# Patient Record
Sex: Male | Born: 1995 | Race: White | Hispanic: No | Marital: Single | Smoking: Never smoker
Health system: Southern US, Community
[De-identification: ages and names within clinical notes are randomized; demographics above are authoritative.]

---
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
## 2014-01-09 ENCOUNTER — Emergency Department: Payer: Self-pay

## 2016-10-31 ENCOUNTER — Emergency Department: Payer: Self-pay

## 2016-10-31 ENCOUNTER — Encounter: Payer: Self-pay | Admitting: Emergency Medicine

## 2016-10-31 ENCOUNTER — Emergency Department
Admission: EM | Admit: 2016-10-31 | Discharge: 2016-10-31 | Disposition: A | Discharge status: Home / Self Care | Payer: Self-pay | Attending: Student in an Organized Health Care Education/Training Program | Admitting: Student in an Organized Health Care Education/Training Program

## 2016-10-31 DIAGNOSIS — S8002XA Contusion of left knee, initial encounter: Secondary | ICD-10-CM | POA: Insufficient documentation

## 2016-10-31 DIAGNOSIS — Y999 Unspecified external cause status: Secondary | ICD-10-CM | POA: Insufficient documentation

## 2016-10-31 DIAGNOSIS — M25572 Pain in left ankle and joints of left foot: Secondary | ICD-10-CM

## 2016-10-31 DIAGNOSIS — Y929 Unspecified place or not applicable: Secondary | ICD-10-CM | POA: Insufficient documentation

## 2016-10-31 DIAGNOSIS — F17200 Nicotine dependence, unspecified, uncomplicated: Secondary | ICD-10-CM | POA: Insufficient documentation

## 2016-10-31 DIAGNOSIS — Y9389 Activity, other specified: Secondary | ICD-10-CM | POA: Insufficient documentation

## 2016-10-31 MED ORDER — NAPROXEN 500 MG PO TABS: 500.0000 mg | ORAL_TABLET | Freq: Two times a day (BID) | ORAL | 0 refills | Status: AC

## 2016-10-31 MED ORDER — HYDROCODONE-ACETAMINOPHEN 5-325 MG PO TABS
1.0000 | ORAL_TABLET | Freq: Once | ORAL | Status: AC
  Administered 2016-10-31: 1 via ORAL
  Filled 2016-10-31: qty 1
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
## 2016-10-31 NOTE — ED Notes (Signed)
NAD noted at time of D/C. Pt denies questions or concerns. Pt ambulatory to the lobby at this time.  
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
## 2022-02-18 ENCOUNTER — Other Ambulatory Visit: Payer: Self-pay

## 2022-02-18 ENCOUNTER — Emergency Department: Payer: Managed Care, Other (non HMO)

## 2022-02-18 ENCOUNTER — Emergency Department
Admission: EM | Admit: 2022-02-18 | Discharge: 2022-02-18 | Disposition: A | Discharge status: Home / Self Care | Payer: Managed Care, Other (non HMO) | Attending: Emergency Medicine | Admitting: Emergency Medicine

## 2022-02-18 DIAGNOSIS — K2900 Acute gastritis without bleeding: Secondary | ICD-10-CM | POA: Diagnosis not present

## 2022-02-18 DIAGNOSIS — R1010 Upper abdominal pain, unspecified: Secondary | ICD-10-CM | POA: Diagnosis present

## 2022-02-18 LAB — CBC
HCT: 48.8 % (ref 39.0–52.0)
Hemoglobin: 16.5 g/dL (ref 13.0–17.0)
MCH: 31.5 pg (ref 26.0–34.0)
MCHC: 33.8 g/dL (ref 30.0–36.0)
MCV: 93.1 fL (ref 80.0–100.0)
Platelets: 245 10*3/uL (ref 150–400)
RBC: 5.24 MIL/uL (ref 4.22–5.81)
RDW: 12.1 % (ref 11.5–15.5)
WBC: 13.2 10*3/uL — ABNORMAL HIGH (ref 4.0–10.5)
nRBC: 0 % (ref 0.0–0.2)

## 2022-02-18 LAB — COMPREHENSIVE METABOLIC PANEL
ALT: 30 U/L (ref 0–44)
AST: 26 U/L (ref 15–41)
Albumin: 4.6 g/dL (ref 3.5–5.0)
Alkaline Phosphatase: 71 U/L (ref 38–126)
Anion gap: 8 (ref 5–15)
BUN: 8 mg/dL (ref 6–20)
CO2: 24 mmol/L (ref 22–32)
Calcium: 9.7 mg/dL (ref 8.9–10.3)
Chloride: 106 mmol/L (ref 98–111)
Creatinine, Ser: 1.08 mg/dL (ref 0.61–1.24)
GFR, Estimated: >60 mL/min (ref >60)
Glucose, Bld: 102 mg/dL — ABNORMAL HIGH (ref 70–99)
Potassium: 4.4 mmol/L (ref 3.5–5.1)
Sodium: 138 mmol/L (ref 135–145)
Total Bilirubin: 0.9 mg/dL (ref 0.3–1.2)
Total Protein: 8.2 g/dL — ABNORMAL HIGH (ref 6.5–8.1)

## 2022-02-18 LAB — URINALYSIS, ROUTINE W REFLEX MICROSCOPIC
Bacteria, UA: NONE SEEN
Bilirubin Urine: NEGATIVE
Glucose, UA: NEGATIVE mg/dL
Hgb urine dipstick: NEGATIVE
Ketones, ur: NEGATIVE mg/dL
Leukocytes,Ua: NEGATIVE
Nitrite: NEGATIVE
Protein, ur: 30 mg/dL — AB
Specific Gravity, Urine: 1.023 (ref 1.005–1.030)
Squamous Epithelial / LPF: NONE SEEN (ref 0–5)
pH: 5 (ref 5.0–8.0)

## 2022-02-18 LAB — LIPASE, BLOOD: Lipase: 27 U/L (ref 11–51)

## 2022-02-18 MED ORDER — KETOROLAC TROMETHAMINE 10 MG PO TABS
10.0000 mg | ORAL_TABLET | Freq: Once | ORAL | Status: AC
  Administered 2022-02-18: 10 mg via ORAL
  Filled 2022-02-18: qty 1

## 2022-02-18 MED ORDER — SUCRALFATE 1 G PO TABS
1.0000 g | ORAL_TABLET | Freq: Once | ORAL | Status: AC
  Administered 2022-02-18: 1 g via ORAL
  Filled 2022-02-18: qty 1

## 2022-02-18 MED ORDER — FAMOTIDINE 20 MG PO TABS: 20.0000 mg | ORAL_TABLET | Freq: Two times a day (BID) | ORAL | 0 refills | Status: AC

## 2022-02-18 MED ORDER — METOCLOPRAMIDE HCL 10 MG PO TABS: 10.0000 mg | ORAL_TABLET | Freq: Four times a day (QID) | ORAL | 0 refills | Status: AC | PRN

## 2022-02-18 MED ORDER — ALUMINUM-MAGNESIUM-SIMETHICONE 200-200-20 MG/5ML PO SUSP: 30.0000 mL | Freq: Three times a day (TID) | ORAL | 0 refills | Status: AC

## 2022-02-18 MED ORDER — FAMOTIDINE 20 MG PO TABS
40.0000 mg | ORAL_TABLET | Freq: Once | ORAL | Status: AC
  Administered 2022-02-18: 40 mg via ORAL
  Filled 2022-02-18: qty 2

## 2022-02-18 NOTE — ED Notes (Addendum)
Pt presents to ED with c/o of middle ABD pain that radiates from epigastric area to lower ABD. Pt denies any urinary symptoms. Pt denies fevers or chills. Pt denies N/V, pt does endorse diarrhea but also reports it was watery but is starting to become more formed at this time.   Pt does state he has been a daily drinker for about 3 years but cannot tell this RN the amount because "it varied due to the day I had". Pt is A&Ox4 at this time.

## 2022-02-18 NOTE — ED Notes (Signed)
D/C, new RX, and reasons to return to ED discussed with pt, pt verbalized understanding.

## 2022-02-18 NOTE — ED Triage Notes (Signed)
Pt reports for past 3 days has had right upper and pain. Pt reports went to [facility] today and they advised him to come to the ED and rule out gallbladder issues.

## 2022-02-18 NOTE — ED Provider Notes (Signed)
[facility] Provider Note    Event Date/Time   First MD Initiated Contact with Patient 02/18/22 1002     (approximate)   History   Chief Complaint: Abdominal Pain   HPI  Grant Mosley is a 26 y.o. male with no significant past medical history who comes ED complaining of upper abdominal pain for the past 3 days, waxing and waning.  Worse after eating.  No alleviating factors.  Nonradiating.  Associated with nausea.  No fever.  No vomiting.  Normal bowel movements  Patient also reports that his 2 kids have been sick with a viral URI type illness over the last several days.     Physical Exam   Triage Vital Signs: ED Triage Vitals  Enc Vitals Group     BP 02/18/22 0953 (!) 147/84     Pulse Rate 02/18/22 0953 (!) 104     Resp 02/18/22 0953 16     Temp 02/18/22 0953 98.5 F (36.9 C)     Temp Source 02/18/22 0953 Oral     SpO2 02/18/22 0953 100 %     Weight --      Height 02/18/22 0954 5\' 9"  (1.753 m)     Head Circumference --      Peak Flow --      Pain Score 02/18/22 0954 0     Pain Loc --      Pain Edu? --      Excl. in GC? --     Most recent vital signs: Vitals:   02/18/22 0953 02/18/22 1200  BP: (!) 147/84 (!) 110/92  Pulse: (!) 104 98  Resp: 16 17  Temp: 98.5 F (36.9 C)   SpO2: 100% 100%    General: Awake, no distress.  CV:  Good peripheral perfusion.  Regular rate rhythm Resp:  Normal effort.  Clear to auscultation bilaterally Abd:  No distention.  Soft with diffuse upper abdominal tenderness.  Negative Murphy sign Other:  Moist oral mucosa.  Nontoxic   ED Results / Procedures / Treatments   Labs (all labs ordered are listed, but only abnormal results are displayed) Labs Reviewed  COMPREHENSIVE METABOLIC PANEL - Abnormal; Notable for the following components:      Result Value   Glucose, Bld 102 (*)    Total Protein 8.2 (*)    All other components within normal limits  CBC - Abnormal; Notable for the following  components:   WBC 13.2 (*)    All other components within normal limits  URINALYSIS, ROUTINE W REFLEX MICROSCOPIC - Abnormal; Notable for the following components:   Color, Urine YELLOW (*)    APPearance CLEAR (*)    Protein, ur 30 (*)    All other components within normal limits  LIPASE, BLOOD     EKG    RADIOLOGY Ultrasound right upper quadrant interpreted by me, negative for signs of cholecystitis.  Radiology report reviewed   PROCEDURES:  Procedures   MEDICATIONS ORDERED IN ED: Medications  sucralfate (CARAFATE) tablet 1 g (1 g Oral Given 02/18/22 1121)  famotidine (PEPCID) tablet 40 mg (40 mg Oral Given 02/18/22 1123)  ketorolac (TORADOL) tablet 10 mg (10 mg Oral Given 02/18/22 1121)     IMPRESSION / MDM / ASSESSMENT AND PLAN / ED COURSE  I reviewed the triage vital signs and the nursing notes.  Differential diagnosis includes, but is not limited to, cholecystitis, pancreatitis, gastritis, viral illness  Patient's presentation is most consistent with acute complicated illness / injury requiring diagnostic workup.  Patient presents with upper abdominal pain and nausea.  Exam is somewhat nonfocal, I think presentation is most consistent with gastritis.  Labs and ultrasound right upper quadrant are all unremarkable.  Patient is feeling better and tolerating oral intake.  He is nontoxic, abdomen is soft and midline.  Stable for discharge.  We will continue on antacid regimen       FINAL CLINICAL IMPRESSION(S) / ED DIAGNOSES   Final diagnoses:  Acute gastritis without hemorrhage, unspecified gastritis type     Rx / DC Orders   ED Discharge Orders          Ordered    metoCLOPramide (REGLAN) 10 MG tablet  Every 6 hours PRN        02/18/22 1336    famotidine (PEPCID) 20 MG tablet  2 times daily        02/18/22 1336    aluminum-magnesium hydroxide-simethicone (MAALOX) 200-200-20 MG/5ML SUSP  3 times daily before meals & bedtime         02/18/22 1336             Note:  This document was prepared using Dragon voice recognition software and may include unintentional dictation errors.   Sharman Cheek, MD 02/18/22 [phone]
